# Patient Record
Sex: Female | Born: 1995 | Race: Black or African American | Hispanic: No | Marital: Single | Smoking: Never smoker
Health system: Southern US, Community
[De-identification: ages and names within clinical notes are randomized; demographics above are authoritative.]

---
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
## 2015-04-15 ENCOUNTER — Encounter (HOSPITAL_COMMUNITY): Payer: Self-pay | Admitting: Emergency Medicine

## 2015-04-15 ENCOUNTER — Emergency Department (INDEPENDENT_AMBULATORY_CARE_PROVIDER_SITE_OTHER)
Admission: EM | Admit: 2015-04-15 | Discharge: 2015-04-15 | Disposition: A | Discharge status: Home / Self Care | Payer: Self-pay | Source: Home / Self Care | Attending: Family Medicine | Admitting: Family Medicine

## 2015-04-15 DIAGNOSIS — J069 Acute upper respiratory infection, unspecified: Secondary | ICD-10-CM

## 2015-04-15 LAB — POCT RAPID STREP A: STREPTOCOCCUS, GROUP A SCREEN (DIRECT): NEGATIVE

## 2015-04-15 NOTE — ED Notes (Signed)
Sinus congestion and aching.  Reports headache, runny nose, no ear pain, has a sore throat and no fever

## 2015-04-15 NOTE — Discharge Instructions (Signed)
Upper Respiratory Infection, Adult Allegra or Claritin for drainage Sudafed PE 10 mg every 4 hours as needed for congestion Frequent use of saline nasal spray Ibuprofen or Tylenol for discomfort or fever Lots of fluids An upper respiratory infection (URI) is also sometimes known as the common cold. The upper respiratory tract includes the nose, sinuses, throat, trachea, and bronchi. Bronchi are the airways leading to the lungs. Most people improve within 1 week, but symptoms can last up to 2 weeks. A residual cough may last even longer.  CAUSES Many different viruses can infect the tissues lining the upper respiratory tract. The tissues become irritated and inflamed and often become very moist. Mucus production is also common. A cold is contagious. You can easily spread the virus to others by oral contact. This includes kissing, sharing a glass, coughing, or sneezing. Touching your mouth or nose and then touching a surface, which is then touched by another person, can also spread the virus. SYMPTOMS  Symptoms typically develop 1 to 3 days after you come in contact with a cold virus. Symptoms vary from person to person. They may include:  Runny nose.  Sneezing.  Nasal congestion.  Sinus irritation.  Sore throat.  Loss of voice (laryngitis).  Cough.  Fatigue.  Muscle aches.  Loss of appetite.  Headache.  Low-grade fever. DIAGNOSIS  You might diagnose your own cold based on familiar symptoms, since most people get a cold 2 to 3 times a year. Your caregiver can confirm this based on your exam. Most importantly, your caregiver can check that your symptoms are not due to another disease such as strep throat, sinusitis, pneumonia, asthma, or epiglottitis. Blood tests, throat tests, and X-rays are not necessary to diagnose a common cold, but they may sometimes be helpful in excluding other more serious diseases. Your caregiver will decide if any further tests are required. RISKS AND  COMPLICATIONS  You may be at risk for a more severe case of the common cold if you smoke cigarettes, have chronic heart disease (such as heart failure) or lung disease (such as asthma), or if you have a weakened immune system. The very young and very old are also at risk for more serious infections. Bacterial sinusitis, middle ear infections, and bacterial pneumonia can complicate the common cold. The common cold can worsen asthma and chronic obstructive pulmonary disease (COPD). Sometimes, these complications can require emergency medical care and may be life-threatening. PREVENTION  The best way to protect against getting a cold is to practice good hygiene. Avoid oral or hand contact with people with cold symptoms. Wash your hands often if contact occurs. There is no clear evidence that vitamin C, vitamin E, echinacea, or exercise reduces the chance of developing a cold. However, it is always recommended to get plenty of rest and practice good nutrition. TREATMENT  Treatment is directed at relieving symptoms. There is no cure. Antibiotics are not effective, because the infection is caused by a virus, not by bacteria. Treatment may include:  Increased fluid intake. Sports drinks offer valuable electrolytes, sugars, and fluids.  Breathing heated mist or steam (vaporizer or shower).  Eating chicken soup or other clear broths, and maintaining good nutrition.  Getting plenty of rest.  Using gargles or lozenges for comfort.  Controlling fevers with ibuprofen or acetaminophen as directed by your caregiver.  Increasing usage of your inhaler if you have asthma. Zinc gel and zinc lozenges, taken in the first 24 hours of the common cold, can shorten the duration  and lessen the severity of symptoms. Pain medicines may help with fever, muscle aches, and throat pain. A variety of non-prescription medicines are available to treat congestion and runny nose. Your caregiver can make recommendations and may  suggest nasal or lung inhalers for other symptoms.  HOME CARE INSTRUCTIONS   Only take over-the-counter or prescription medicines for pain, discomfort, or fever as directed by your caregiver.  Use a warm mist humidifier or inhale steam from a shower to increase air moisture. This may keep secretions moist and make it easier to breathe.  Drink enough water and fluids to keep your urine clear or pale yellow.  Rest as needed.  Return to work when your temperature has returned to normal or as your caregiver advises. You may need to stay home longer to avoid infecting others. You can also use a face mask and careful hand washing to prevent spread of the virus. SEEK MEDICAL CARE IF:   After the first few days, you feel you are getting worse rather than better.  You need your caregiver's advice about medicines to control symptoms.  You develop chills, worsening shortness of breath, or brown or red sputum. These may be signs of pneumonia.  You develop yellow or brown nasal discharge or pain in the face, especially when you bend forward. These may be signs of sinusitis.  You develop a fever, swollen neck glands, pain with swallowing, or white areas in the back of your throat. These may be signs of strep throat. SEEK IMMEDIATE MEDICAL CARE IF:   You have a fever.  You develop severe or persistent headache, ear pain, sinus pain, or chest pain.  You develop wheezing, a prolonged cough, cough up blood, or have a change in your usual mucus (if you have chronic lung disease).  You develop sore muscles or a stiff neck. Document Released: 02/10/2001 Document Revised: 11/09/2011 Document Reviewed: 11/22/2013 [facility] LPExitCare Patient Information 2015 [place], [place]. This information is not intended to replace advice given to you by your health care provider. Make sure you discuss any questions you have with your health care provider.

## 2015-04-15 NOTE — ED Provider Notes (Signed)
CSN: [id number]     Arrival date & time 04/15/15  1552 History   First MD Initiated Contact with Patient 04/15/15 1830     Chief Complaint  Patient presents with  . URI   (Consider location/radiation/quality/duration/timing/severity/associated sxs/prior Treatment) HPI Comments: 19 year old female presenting with a "sinus headache" sore throat, stuffy nose. Denies runny nose, fever, earache. Denies taking any medications for symptoms.   History reviewed. No pertinent past medical history. History reviewed. No pertinent past surgical history. No family history on file. Social History  Substance Use Topics  . Smoking status: Never Smoker   . Smokeless tobacco: None  . Alcohol Use: No   OB History    No data available     Review of Systems  Constitutional: Negative for fever and activity change.  HENT: Positive for congestion and sore throat. Negative for ear discharge, facial swelling, postnasal drip and rhinorrhea.   Eyes: Negative.   Respiratory: Negative.   Cardiovascular: Negative.   Genitourinary: Negative.   Musculoskeletal: Negative for neck pain and neck stiffness.  Skin: Negative for pallor and rash.  Neurological: Negative.     Allergies  Review of patient's allergies indicates no known allergies.  Home Medications   Prior to Admission medications   Not on File   BP 112/70 mmHg  Pulse 111  Temp(Src) 100 F (37.8 C) (Oral)  Resp 16  SpO2 100%  LMP 04/15/2015 Physical Exam  Constitutional: She is oriented to person, place, and time. She appears well-developed and well-nourished. No distress.  HENT:  Head: Normocephalic and atraumatic.  Mouth/Throat: No oropharyngeal exudate.  Left TM mildly retracted. Right TM obscured by cerumen. Oropharynx with minimal erythema and scant clear PND.  Eyes: Conjunctivae and EOM are normal.  Neck: Normal range of motion. Neck supple.  Cardiovascular: Normal rate, regular rhythm and normal heart sounds.    Pulmonary/Chest: Effort normal and breath sounds normal. No respiratory distress. She has no wheezes. She has no rales.  Musculoskeletal: Normal range of motion. She exhibits no edema.  Lymphadenopathy:    She has no cervical adenopathy.  Neurological: She is alert and oriented to person, place, and time.  Skin: Skin is warm and dry. No rash noted.  Psychiatric: She has a normal mood and affect.  Nursing note and vitals reviewed.   ED Course  Procedures (including critical care time) Labs Review Labs Reviewed  POCT RAPID STREP A    Imaging Review No results found.   MDM   1. URI (upper respiratory infection)     Allegra or Claritin for drainage Sudafed PE 10 mg every 4 hours as needed for congestion Frequent use of saline nasal spray Ibuprofen or Tylenol for discomfort or fever Lots of fluids   Hayden Rasmussen, NP 04/15/15 [phone]

## 2015-04-17 LAB — CULTURE, GROUP A STREP: STREP A CULTURE: NEGATIVE

## 2015-04-18 NOTE — ED Notes (Signed)
Final report of strep testing negative  

## 2016-12-04 ENCOUNTER — Emergency Department (HOSPITAL_COMMUNITY): Payer: Self-pay

## 2016-12-04 ENCOUNTER — Emergency Department (HOSPITAL_COMMUNITY)
Admission: EM | Admit: 2016-12-04 | Discharge: 2016-12-04 | Disposition: A | Discharge status: Home / Self Care | Payer: Self-pay | Attending: Emergency Medicine | Admitting: Emergency Medicine

## 2016-12-04 ENCOUNTER — Encounter (HOSPITAL_COMMUNITY): Payer: Self-pay

## 2016-12-04 DIAGNOSIS — R1012 Left upper quadrant pain: Secondary | ICD-10-CM | POA: Insufficient documentation

## 2016-12-04 DIAGNOSIS — R109 Unspecified abdominal pain: Secondary | ICD-10-CM

## 2016-12-04 LAB — URINALYSIS, ROUTINE W REFLEX MICROSCOPIC
BILIRUBIN URINE: NEGATIVE
Glucose, UA: NEGATIVE mg/dL
HGB URINE DIPSTICK: NEGATIVE
KETONES UR: NEGATIVE mg/dL
Leukocytes, UA: NEGATIVE
NITRITE: NEGATIVE
PH: 6 (ref 5.0–8.0)
Protein, ur: NEGATIVE mg/dL
SPECIFIC GRAVITY, URINE: 1.02 (ref 1.005–1.030)

## 2016-12-04 LAB — CBC WITH DIFFERENTIAL/PLATELET
BASOS PCT: 0 %
Basophils Absolute: 0 10*3/uL (ref 0.0–0.1)
EOS ABS: 0 10*3/uL (ref 0.0–0.7)
Eosinophils Relative: 0 %
HCT: 39.6 % (ref 36.0–46.0)
HEMOGLOBIN: 12.9 g/dL (ref 12.0–15.0)
LYMPHS ABS: 2.9 10*3/uL (ref 0.7–4.0)
Lymphocytes Relative: 43 %
MCH: 30.4 pg (ref 26.0–34.0)
MCHC: 32.6 g/dL (ref 30.0–36.0)
MCV: 93.4 fL (ref 78.0–100.0)
MONOS PCT: 7 %
Monocytes Absolute: 0.5 10*3/uL (ref 0.1–1.0)
NEUTROS PCT: 50 %
Neutro Abs: 3.4 10*3/uL (ref 1.7–7.7)
Platelets: 204 10*3/uL (ref 150–400)
RBC: 4.24 MIL/uL (ref 3.87–5.11)
RDW: 12.9 % (ref 11.5–15.5)
WBC: 6.8 10*3/uL (ref 4.0–10.5)

## 2016-12-04 LAB — COMPREHENSIVE METABOLIC PANEL
ALBUMIN: 3.8 g/dL (ref 3.5–5.0)
ALT: 16 U/L (ref 14–54)
AST: 20 U/L (ref 15–41)
Alkaline Phosphatase: 67 U/L (ref 38–126)
Anion gap: 8 (ref 5–15)
BUN: 10 mg/dL (ref 6–20)
CHLORIDE: 106 mmol/L (ref 101–111)
CO2: 25 mmol/L (ref 22–32)
CREATININE: 0.7 mg/dL (ref 0.44–1.00)
Calcium: 9.3 mg/dL (ref 8.9–10.3)
GFR calc Af Amer: >60 mL/min (ref >60)
GFR calc non Af Amer: >60 mL/min (ref >60)
Glucose, Bld: 86 mg/dL (ref 65–99)
Potassium: 3.9 mmol/L (ref 3.5–5.1)
SODIUM: 139 mmol/L (ref 135–145)
Total Bilirubin: 0.7 mg/dL (ref 0.3–1.2)
Total Protein: 6.9 g/dL (ref 6.5–8.1)

## 2016-12-04 LAB — I-STAT BETA HCG BLOOD, ED (MC, WL, AP ONLY): I-stat hCG, quantitative: <5 m[IU]/mL (ref <5)

## 2016-12-04 LAB — LIPASE, BLOOD: Lipase: 11 U/L (ref 11–51)

## 2016-12-04 MED ORDER — SODIUM CHLORIDE 0.9 % IV BOLUS (SEPSIS)
1000.0000 mL | Freq: Once | INTRAVENOUS | Status: AC
  Administered 2016-12-04: 1000 mL via INTRAVENOUS

## 2016-12-04 MED ORDER — CYCLOBENZAPRINE HCL 10 MG PO TABS: 10.0000 mg | ORAL_TABLET | Freq: Two times a day (BID) | ORAL | 0 refills | Status: AC | PRN

## 2016-12-04 MED ORDER — HYDROMORPHONE HCL 1 MG/ML IJ SOLN: 0.5000 mg | INTRAMUSCULAR | Status: DC | PRN

## 2016-12-04 MED ORDER — ONDANSETRON HCL 4 MG/2ML IJ SOLN
4.0000 mg | Freq: Once | INTRAMUSCULAR | Status: DC
Start: 2016-12-04 — End: 2016-12-04

## 2016-12-04 MED ORDER — SODIUM CHLORIDE 0.9 % IV SOLN
INTRAVENOUS | Status: DC
  Administered 2016-12-04: 125 mL/h via INTRAVENOUS

## 2016-12-04 MED ORDER — NAPROXEN 500 MG PO TABS: 500.0000 mg | ORAL_TABLET | Freq: Two times a day (BID) | ORAL | 0 refills | Status: AC

## 2016-12-04 NOTE — ED Triage Notes (Signed)
Pt states she woke up with left side throbbing flank pain. She reports the pain has subsided some now. Denies n/v. Denies urinary symptoms.

## 2016-12-04 NOTE — ED Provider Notes (Signed)
[facility] Provider Note   CSN: [id number] Arrival date & time: 12/04/16  9147     History   Chief Complaint Chief Complaint  Patient presents with  . Flank Pain    HPI Denise Dyer is a 21 y.o. female.  HPI Patient presents to the emergency room with complaints of left-sided flank pain. Patient states she woke up this morning with severe throbbing him a sharp pain.   It woke her up from sleep. The pain continued so she came to the emergency room for evaluation. It is somewhat better now but not resolved. She denies any nausea or vomiting. No dysuria urgency or frequency. No fevers or chills. No cough or shortness of breath. The pain is in the left flank and anterior aspect of the left upper quadrant and left lower chest. It does not radiate. She denies any history of kidney stones. No recent falls or injury. She has had some sharp intermittent episodes of pain on that left side in the past but has never been formally diagnosed with any medical condition. History reviewed. No pertinent past medical history.  There are no active problems to display for this patient.   History reviewed. No pertinent surgical history.  OB History    No data available       Home Medications    Prior to Admission medications   Medication Sig Start Date End Date Taking? Authorizing Provider  cyclobenzaprine (FLEXERIL) 10 MG tablet Take 1 tablet (10 mg total) by mouth 2 (two) times daily as needed for muscle spasms. 12/04/16   Linwood Dibbles, MD  naproxen (NAPROSYN) 500 MG tablet Take 1 tablet (500 mg total) by mouth 2 (two) times daily. 12/04/16   Linwood Dibbles, MD    Family History History reviewed. No pertinent family history.  Social History Social History  Substance Use Topics  . Smoking status: Never Smoker  . Smokeless tobacco: Never Used  . Alcohol use No     Allergies   Patient has no known allergies.   Review of Systems Review of Systems  Constitutional: Negative for  fever.  Respiratory: Negative for shortness of breath.   Genitourinary: Positive for flank pain. Negative for dysuria, enuresis, urgency, vaginal bleeding and vaginal discharge.  All other systems reviewed and are negative.    Physical Exam Updated Vital Signs BP 113/67   Pulse 93   Temp 98.4 F (36.9 C) (Oral)   Resp 16   SpO2 99%   Physical Exam  Constitutional: She appears well-developed and well-nourished. No distress.  HENT:  Head: Normocephalic and atraumatic.  Right Ear: External ear normal.  Left Ear: External ear normal.  Eyes: Conjunctivae are normal. Right eye exhibits no discharge. Left eye exhibits no discharge. No scleral icterus.  Neck: Neck supple. No tracheal deviation present.  Cardiovascular: Normal rate, regular rhythm and intact distal pulses.   Pulmonary/Chest: Effort normal and breath sounds normal. No stridor. No respiratory distress. She has no wheezes. She has no rales.  Abdominal: Soft. Bowel sounds are normal. She exhibits no distension. There is tenderness in the left upper quadrant. There is CVA tenderness. There is no rebound and no guarding.  Musculoskeletal: She exhibits no edema or tenderness.  Neurological: She is alert. She has normal strength. No cranial nerve deficit (no facial droop, extraocular movements intact, no slurred speech) or sensory deficit. She exhibits normal muscle tone. She displays no seizure activity. Coordination normal.  Skin: Skin is warm and dry. No rash noted.  Psychiatric:  She has a normal mood and affect.  Nursing note and vitals reviewed.    ED Treatments / Results  Labs (all labs ordered are listed, but only abnormal results are displayed) Labs Reviewed  COMPREHENSIVE METABOLIC PANEL  LIPASE, BLOOD  CBC WITH DIFFERENTIAL/PLATELET  URINALYSIS, ROUTINE W REFLEX MICROSCOPIC  I-STAT BETA HCG BLOOD, ED ([facility], [facility], [facility] ONLY)     Radiology Ct Abdomen Pelvis Wo Contrast  Result Date: 12/04/2016 CLINICAL DATA:  Awoke  with throbbing left flank pain. EXAM: CT ABDOMEN AND PELVIS WITHOUT CONTRAST TECHNIQUE: Multidetector CT imaging of the abdomen and pelvis was performed following the standard protocol without IV contrast. COMPARISON:  None. FINDINGS: Lower chest: Normal Hepatobiliary: Liver parenchyma is normal.  No calcified gallstones. Pancreas: Normal Spleen: Normal Adrenals/Urinary Tract: Adrenal glands are normal. Kidneys are normal. No cyst, mass, stone or hydronephrosis. Stomach/Bowel: Retrocecal appendix appears normal. No evidence of ileus or obstruction. No evidence of other focal bowel finding. Vascular/Lymphatic: Normal Reproductive: Normal Other: No free fluid or air. Musculoskeletal: Normal IMPRESSION: Normal examination.  No abnormality seen to explain left-sided pain. Electronically Signed   By: Paulina Fusi M.D.   On: 12/04/2016 12:26   Dg Chest 2 View  Result Date: 12/04/2016 CLINICAL DATA:  Left flank pain EXAM: CHEST  2 VIEW COMPARISON:  None. FINDINGS: Lungs are clear. Heart size and pulmonary vascularity are normal. No adenopathy. No pneumothorax. No bone lesions. IMPRESSION: No edema or consolidation. Electronically Signed   By: Bretta Bang III M.D.   On: 12/04/2016 09:11    Procedures Procedures (including critical care time)  Medications Ordered in ED Medications  sodium chloride 0.9 % bolus 1,000 mL (0 mLs Intravenous Stopped 12/04/16 1127)    And  0.9 %  sodium chloride infusion (125 mL/hr Intravenous New Bag/Given 12/04/16 1128)  HYDROmorphone (DILAUDID) injection 0.5 mg (not administered)  ondansetron (ZOFRAN) injection 4 mg (4 mg Intravenous Not Given 12/04/16 1004)     Initial Impression / Assessment and Plan / ED Course  I have reviewed the triage vital signs and the nursing notes.  Pertinent labs & imaging results that were available during my care of the patient were reviewed by me and considered in my medical decision making (see chart for details).   Pt Presents to the  emergency room with left-sided flank pain. Laboratory tests are reassuring. CT scan does not show any evidence of urolithiasis or other acute abnormality.  Plan is for discharge home with NSAIDs and muscle relaxants. Follow up with primary care doctor.  Final Clinical Impressions(s) / ED Diagnoses   Final diagnoses:  Flank pain    New Prescriptions New Prescriptions   CYCLOBENZAPRINE (FLEXERIL) 10 MG TABLET    Take 1 tablet (10 mg total) by mouth 2 (two) times daily as needed for muscle spasms.   NAPROXEN (NAPROSYN) 500 MG TABLET    Take 1 tablet (500 mg total) by mouth 2 (two) times daily.     Linwood Dibbles, MD 12/04/16 1309

## 2016-12-04 NOTE — ED Notes (Signed)
Pt states she understands instructionsand home stable with steady gait with friend.

## 2016-12-04 NOTE — Discharge Instructions (Signed)
Take medications as needed for pain, follow-up with a primary care doctor in a couple of weeks to discuss your symptoms further

## 2018-07-02 IMAGING — CT CT ABD-PELV W/O CM
2 of 4 series · 17 of 46 positions shown, 19 images · non-contrast
Comparison: None.

CLINICAL DATA: Awoke with throbbing left flank pain.

EXAM:
CT ABDOMEN AND PELVIS WITHOUT CONTRAST
TECHNIQUE: Multidetector CT imaging of the abdomen and pelvis was performed
following the standard protocol without IV contrast.

[Series 3: a/p w/o 5mm · axial · non-contrast · 0.86mm/px · z∈[+750,+1184]mm · 14 of 95 slices shown, 16 images]
[im 4/95  soft-tissue]
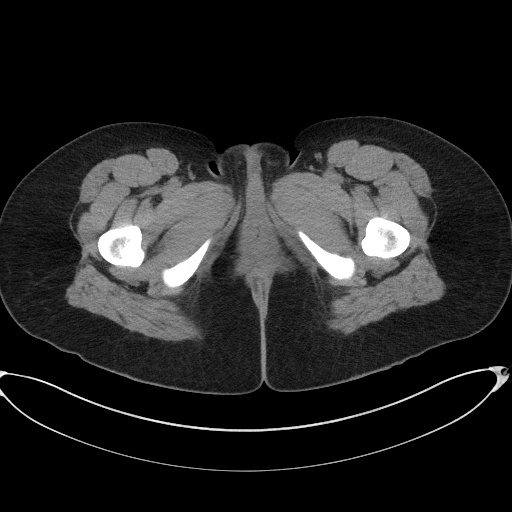
[im 4/95  bone]
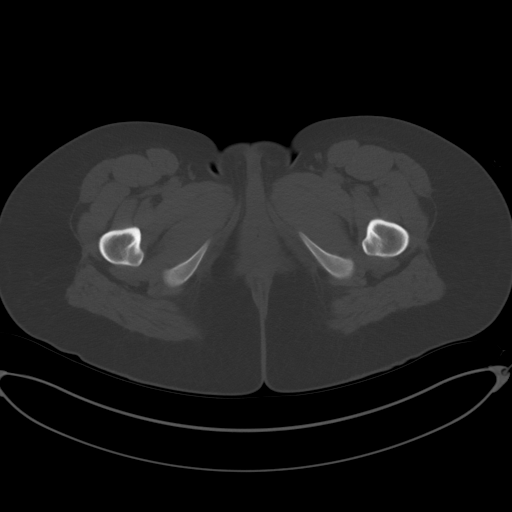
[im 12/95  soft-tissue]
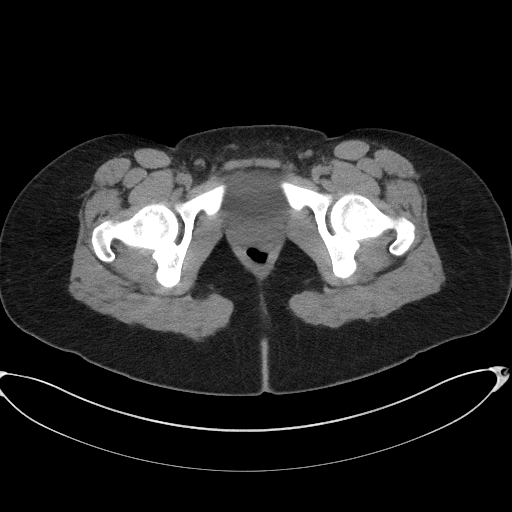
[im 19/95  soft-tissue]
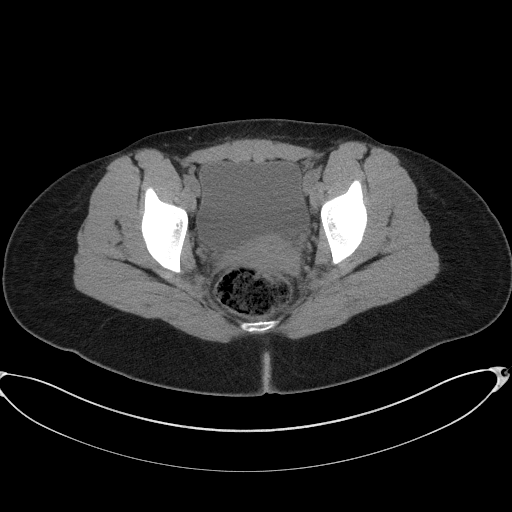
[im 27/95  soft-tissue]
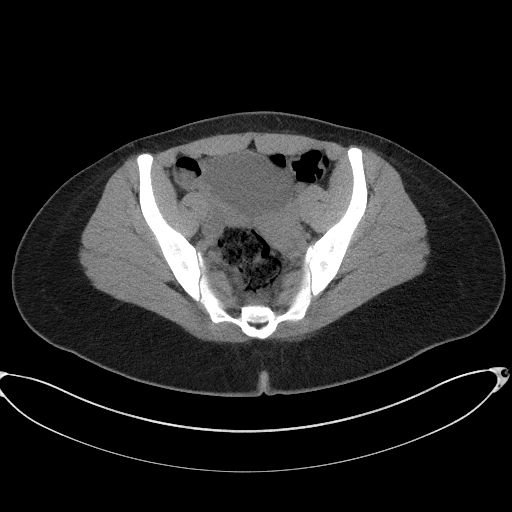
[im 31/95  soft-tissue]
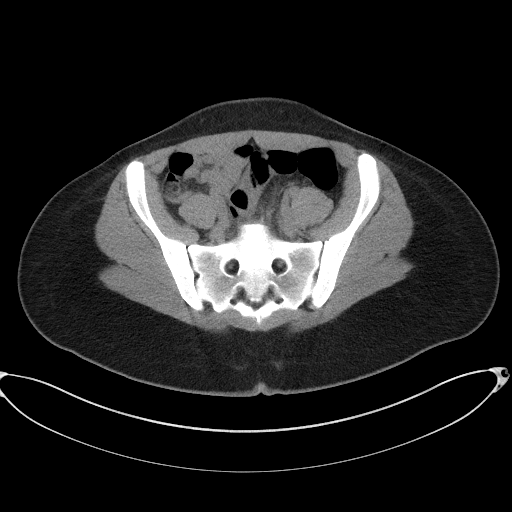
[im 38/95  soft-tissue]
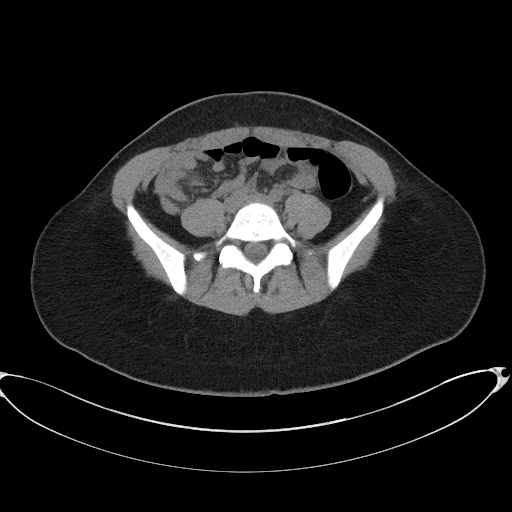
[im 46/95  soft-tissue]
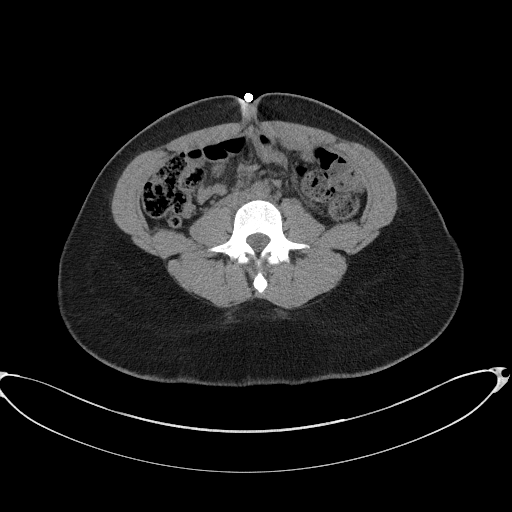
[im 49/95  soft-tissue]
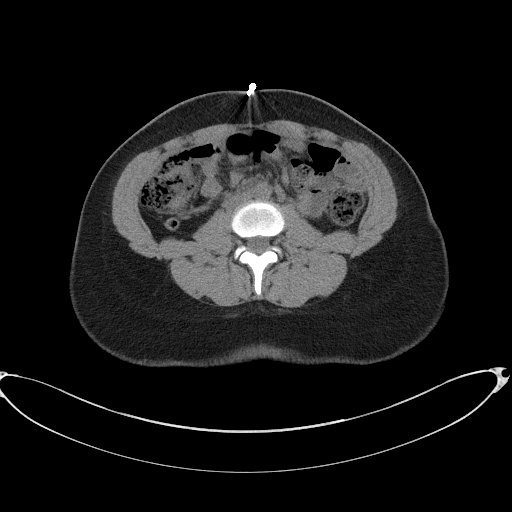
[im 57/95  soft-tissue]
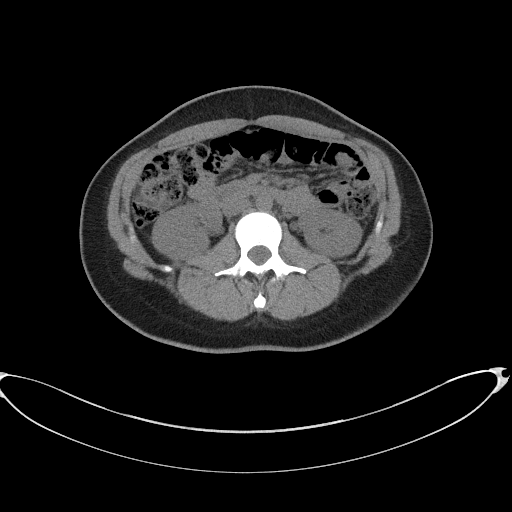
[im 57/95  bone]
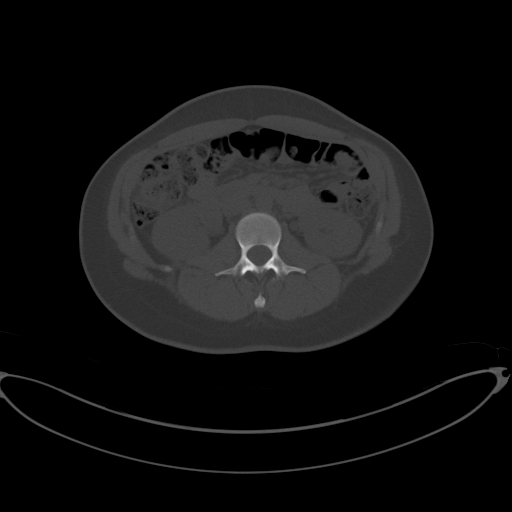
[im 64/95  soft-tissue]
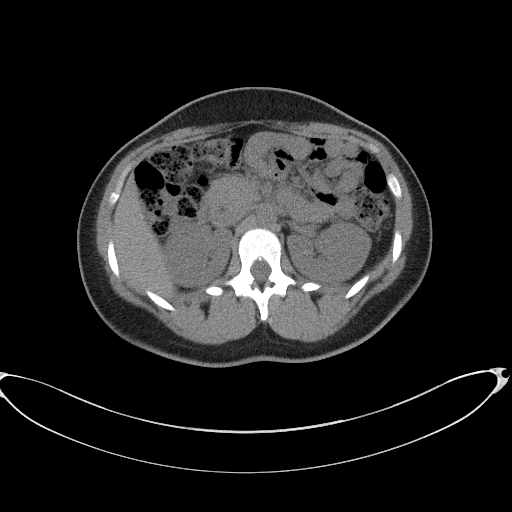
[im 72/95  soft-tissue]
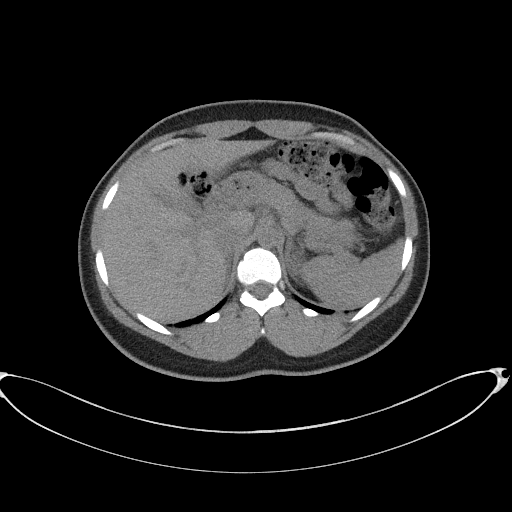
[im 76/95  soft-tissue]
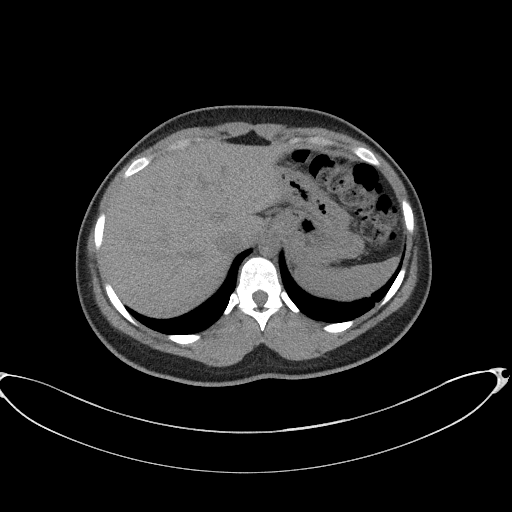
[im 83/95  soft-tissue]
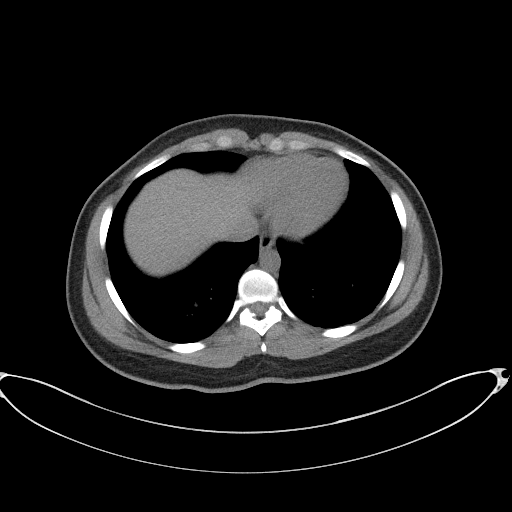
[im 91/95  soft-tissue]
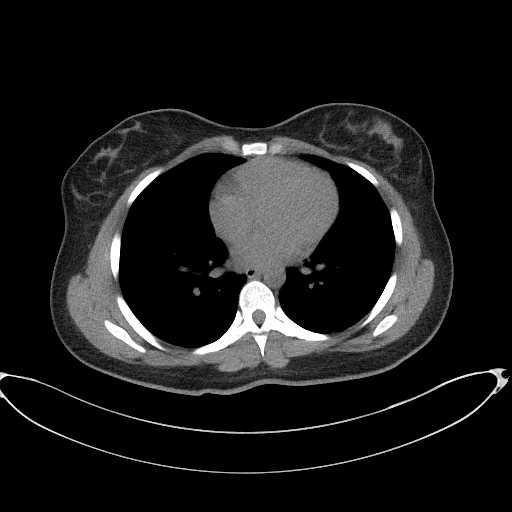

[Series 6: a/p w/o cor · coronal · non-contrast · 0.77mm/px · 3 of 146 slices shown]
[im 49/146  soft-tissue]
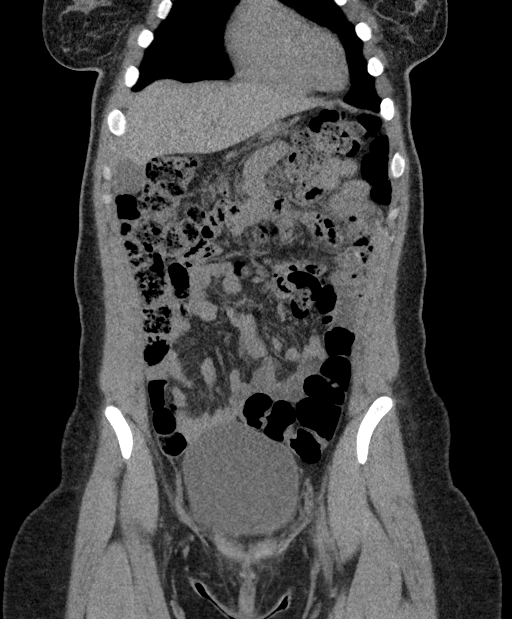
[im 65/146  soft-tissue]
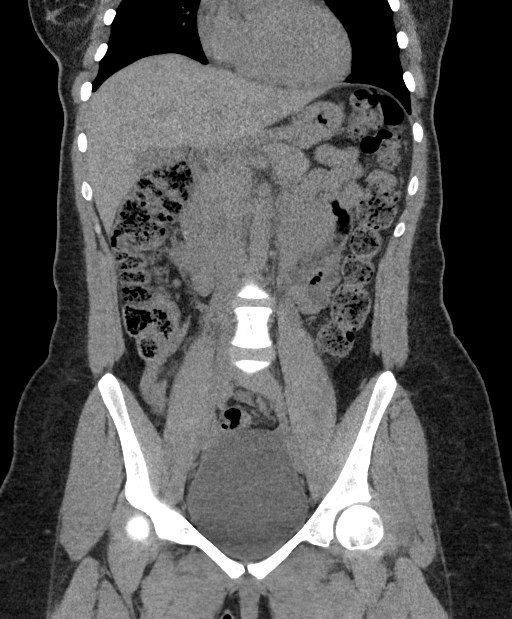
[im 81/146  soft-tissue]
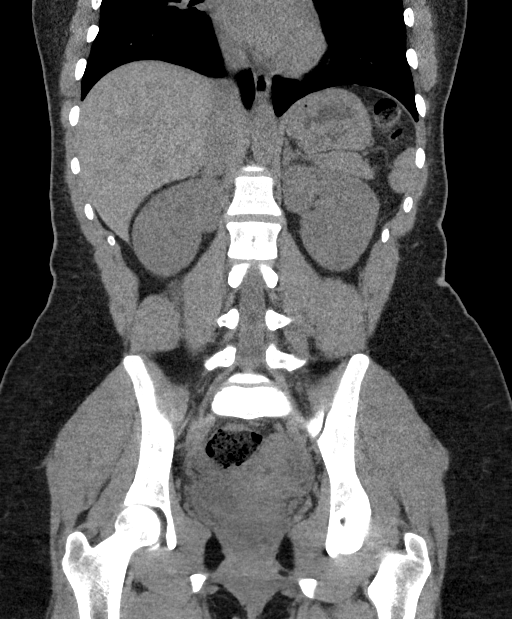

[17 of 46 positions shown; findings below may reference images not displayed]

FINDINGS: Lower chest: Normal

Hepatobiliary: Liver parenchyma is normal.  No calcified gallstones.

Pancreas: Normal

Spleen: Normal

Adrenals/Urinary Tract: Adrenal glands are normal. Kidneys are
normal. No cyst, mass, stone or hydronephrosis.

Stomach/Bowel: Retrocecal appendix appears normal. No evidence of
ileus or obstruction. No evidence of other focal bowel finding.

Vascular/Lymphatic: Normal

Reproductive: Normal

Other: No free fluid or air.

Musculoskeletal: Normal
IMPRESSION: Normal examination.  No abnormality seen to explain left-sided pain.

## 2020-06-17 ENCOUNTER — Ambulatory Visit (HOSPITAL_COMMUNITY)
Admission: RE | Admit: 2020-06-17 | Discharge: 2020-06-17 | Disposition: A | Discharge status: Home / Self Care | Payer: Self-pay | Source: Ambulatory Visit | Attending: Family Medicine | Admitting: Family Medicine

## 2020-06-17 ENCOUNTER — Other Ambulatory Visit: Payer: Self-pay

## 2020-06-17 ENCOUNTER — Encounter (HOSPITAL_COMMUNITY): Payer: Self-pay

## 2020-06-17 VITALS — BP 113/66 | HR 83 | Temp 98.4°F | Resp 18

## 2020-06-17 DIAGNOSIS — N938 Other specified abnormal uterine and vaginal bleeding: Secondary | ICD-10-CM | POA: Insufficient documentation

## 2020-06-17 DIAGNOSIS — R109 Unspecified abdominal pain: Secondary | ICD-10-CM

## 2020-06-17 DIAGNOSIS — Z3202 Encounter for pregnancy test, result negative: Secondary | ICD-10-CM

## 2020-06-17 LAB — CBC WITH DIFFERENTIAL/PLATELET
Abs Immature Granulocytes: 0.01 10*3/uL (ref 0.00–0.07)
Basophils Absolute: 0 10*3/uL (ref 0.0–0.1)
Basophils Relative: 0 %
Eosinophils Absolute: 0 10*3/uL (ref 0.0–0.5)
Eosinophils Relative: 0 %
HCT: 38.3 % (ref 36.0–46.0)
Hemoglobin: 12.3 g/dL (ref 12.0–15.0)
Immature Granulocytes: 0 %
Lymphocytes Relative: 34 %
Lymphs Abs: 2.7 10*3/uL (ref 0.7–4.0)
MCH: 30.3 pg (ref 26.0–34.0)
MCHC: 32.1 g/dL (ref 30.0–36.0)
MCV: 94.3 fL (ref 80.0–100.0)
Monocytes Absolute: 0.6 10*3/uL (ref 0.1–1.0)
Monocytes Relative: 8 %
Neutro Abs: 4.7 10*3/uL (ref 1.7–7.7)
Neutrophils Relative %: 58 %
Platelets: 267 10*3/uL (ref 150–400)
RBC: 4.06 MIL/uL (ref 3.87–5.11)
RDW: 12.6 % (ref 11.5–15.5)
WBC: 8 10*3/uL (ref 4.0–10.5)
nRBC: 0 % (ref 0.0–0.2)

## 2020-06-17 LAB — BASIC METABOLIC PANEL
Anion gap: 9 (ref 5–15)
BUN: 13 mg/dL (ref 6–20)
CO2: 24 mmol/L (ref 22–32)
Calcium: 9.1 mg/dL (ref 8.9–10.3)
Chloride: 107 mmol/L (ref 98–111)
Creatinine, Ser: 0.94 mg/dL (ref 0.44–1.00)
GFR, Estimated: >60 mL/min (ref >60)
Glucose, Bld: 91 mg/dL (ref 70–99)
Potassium: 4.1 mmol/L (ref 3.5–5.1)
Sodium: 140 mmol/L (ref 135–145)

## 2020-06-17 LAB — POCT URINALYSIS DIPSTICK, ED / UC
Bilirubin Urine: NEGATIVE
Glucose, UA: NEGATIVE mg/dL
Ketones, ur: NEGATIVE mg/dL
Leukocytes,Ua: NEGATIVE
Nitrite: NEGATIVE
Protein, ur: NEGATIVE mg/dL
Specific Gravity, Urine: 1.025 (ref 1.005–1.030)
Urobilinogen, UA: 1 mg/dL (ref 0.0–1.0)
pH: 7 (ref 5.0–8.0)

## 2020-06-17 LAB — POC URINE PREG, ED: Preg Test, Ur: NEGATIVE

## 2020-06-17 LAB — TSH: TSH: 0.541 u[IU]/mL (ref 0.350–4.500)

## 2020-06-17 NOTE — ED Provider Notes (Signed)
[facility]    CSN: [id number] Arrival date & time: 06/17/20  0840      History   Chief Complaint Chief Complaint  Patient presents with  . Appointment    0900  . Abdominal Cramping    HPI Denise Dyer is a 24 y.o. female.   Here today with concerns of pelvic cramping and pink spotting/discharge x 2 days. Sxs seem to be dissipating today but patient concerned after reading about things like polyps and vaginal hemorrhoids online and wanting to be checked out. LMP was 2 weeks ago per patient. Not on any contraception. No concern for STIs or vaginal trauma. Denies fever, chills, N/V, abdominal pain, abnormal vaginal discharge.      History reviewed. No pertinent past medical history.  There are no problems to display for this patient.   History reviewed. No pertinent surgical history.  OB History   No obstetric history on file.      Home Medications    Prior to Admission medications   Medication Sig Start Date End Date Taking? Authorizing Provider  cyclobenzaprine (FLEXERIL) 10 MG tablet Take 1 tablet (10 mg total) by mouth 2 (two) times daily as needed for muscle spasms. 12/04/16   Linwood Dibbles, MD  naproxen (NAPROSYN) 500 MG tablet Take 1 tablet (500 mg total) by mouth 2 (two) times daily. 12/04/16   Linwood Dibbles, MD    Family History History reviewed. No pertinent family history.  Social History Social History   Tobacco Use  . Smoking status: Never Smoker  . Smokeless tobacco: Never Used  Substance Use Topics  . Alcohol use: No  . Drug use: No     Allergies   Patient has no known allergies.   Review of Systems Review of Systems PER HPI   Physical Exam Triage Vital Signs ED Triage Vitals  Enc Vitals Group     BP 06/17/20 0907 113/66     Pulse Rate 06/17/20 0907 83     Resp 06/17/20 0907 18     Temp 06/17/20 0907 98.4 F (36.9 C)     Temp Source 06/17/20 0907 Oral     SpO2 06/17/20 0907 99 %     Weight --      Height --       Head Circumference --      Peak Flow --      Pain Score 06/17/20 0906 0     Pain Loc --      Pain Edu? --      Excl. in GC? --    No data found.  Updated Vital Signs BP 113/66 (BP Location: Right Arm)   Pulse 83   Temp 98.4 F (36.9 C) (Oral)   Resp 18   LMP  (Within Weeks) Comment: 2 weeks  SpO2 99%   Visual Acuity Right Eye Distance:   Left Eye Distance:   Bilateral Distance:    Right Eye Near:   Left Eye Near:    Bilateral Near:     Physical Exam Vitals and nursing note reviewed. Exam conducted with a chaperone present.  Constitutional:      Appearance: Normal appearance. She is not ill-appearing.  HENT:     Head: Atraumatic.  Eyes:     Extraocular Movements: Extraocular movements intact.     Conjunctiva/sclera: Conjunctivae normal.  Cardiovascular:     Rate and Rhythm: Normal rate and regular rhythm.     Heart sounds: Normal heart sounds.  Pulmonary:  Effort: Pulmonary effort is normal.     Breath sounds: Normal breath sounds.  Abdominal:     General: Bowel sounds are normal. There is no distension.     Palpations: Abdomen is soft.     Tenderness: There is no abdominal tenderness. There is no right CVA tenderness, left CVA tenderness or guarding.  Genitourinary:    Comments: No vaginal abnormalities on internal exam, though visibility mildly affected by blood Musculoskeletal:        General: Normal range of motion.     Cervical back: Normal range of motion and neck supple.  Skin:    General: Skin is warm and dry.  Neurological:     Mental Status: She is alert and oriented to person, place, and time.  Psychiatric:        Mood and Affect: Mood normal.        Thought Content: Thought content normal.        Judgment: Judgment normal.      UC Treatments / Results  Labs (all labs ordered are listed, but only abnormal results are displayed) Labs Reviewed  POCT URINALYSIS DIPSTICK, ED / UC - Abnormal; Notable for the following components:      Result  Value   Hgb urine dipstick LARGE (*)    All other components within normal limits  CBC WITH DIFFERENTIAL/PLATELET  BASIC METABOLIC PANEL  TSH  POC URINE PREG, ED  CERVICOVAGINAL ANCILLARY ONLY    EKG   Radiology No results found.  Procedures Procedures (including critical care time)  Medications Ordered in UC Medications - No data to display  Initial Impression / Assessment and Plan / UC Course  I have reviewed the triage vital signs and the nursing notes.  Pertinent labs & imaging results that were available during my care of the patient were reviewed by me and considered in my medical decision making (see chart for details).     U/A and urine preg benign, aptima swab pending though blood may skew the sample. She is most concerned about why she may be having another menstrual cycle this close to previous one, discussed some possible causes and will r/o some basic labs today. She is adamant about not wanting any sort of hormonal therapies to regulate her cycles at this time. Womens Therapist, music discussed for follow up though she is having insurance issues at the current moment. Return precautions given.   Final Clinical Impressions(s) / UC Diagnoses   Final diagnoses:  Dysfunctional uterine bleeding   Discharge Instructions   None    ED Prescriptions    None     PDMP not reviewed this encounter.   Particia Nearing, [place] 06/17/20 1036

## 2020-06-17 NOTE — ED Triage Notes (Signed)
Pt reports she had lower abdominal pain and spotting x 2 days. Denies dysuria, urinary frequency. Pt denies abdominal pain or spotting today.

## 2020-06-18 ENCOUNTER — Telehealth (HOSPITAL_COMMUNITY): Payer: Self-pay | Admitting: Emergency Medicine

## 2020-06-18 LAB — CERVICOVAGINAL ANCILLARY ONLY
Bacterial Vaginitis (gardnerella): POSITIVE — AB
Candida Glabrata: NEGATIVE
Candida Vaginitis: NEGATIVE
Chlamydia: NEGATIVE
Comment: NEGATIVE
Comment: NEGATIVE
Comment: NEGATIVE
Comment: NEGATIVE
Comment: NEGATIVE
Comment: NORMAL
Neisseria Gonorrhea: NEGATIVE
Trichomonas: NEGATIVE

## 2020-06-18 MED ORDER — METRONIDAZOLE 500 MG PO TABS: 500.0000 mg | ORAL_TABLET | Freq: Two times a day (BID) | ORAL | 0 refills | Status: AC

## 2022-10-08 ENCOUNTER — Telehealth: Payer: Self-pay

## 2022-10-08 NOTE — Telephone Encounter (Signed)
Mychart msg sent. AS, CMA
